# Patient Record
Sex: Male | Born: 1965
Health system: Southern US, Community
[De-identification: ages and names within clinical notes are randomized; demographics above are authoritative.]

## PROBLEM LIST (undated history)

## (undated) DIAGNOSIS — F1010 Alcohol abuse, uncomplicated: Secondary | ICD-10-CM

## (undated) DIAGNOSIS — R519 Headache, unspecified: Secondary | ICD-10-CM

## (undated) DIAGNOSIS — R51 Headache: Secondary | ICD-10-CM

## (undated) DIAGNOSIS — T7840XA Allergy, unspecified, initial encounter: Secondary | ICD-10-CM

## (undated) HISTORY — PX: NO PAST SURGERIES: SHX2092

## (undated) HISTORY — DX: Alcohol abuse, uncomplicated: F10.10

## (undated) HISTORY — DX: Allergy, unspecified, initial encounter: T78.40XA

## (undated) HISTORY — DX: Headache, unspecified: R51.9

## (undated) HISTORY — DX: Headache: R51

---

## 2017-09-21 ENCOUNTER — Emergency Department (HOSPITAL_BASED_OUTPATIENT_CLINIC_OR_DEPARTMENT_OTHER): Payer: BLUE CROSS/BLUE SHIELD

## 2017-09-21 ENCOUNTER — Encounter (HOSPITAL_BASED_OUTPATIENT_CLINIC_OR_DEPARTMENT_OTHER): Payer: Self-pay | Admitting: Emergency Medicine

## 2017-09-21 ENCOUNTER — Emergency Department (HOSPITAL_BASED_OUTPATIENT_CLINIC_OR_DEPARTMENT_OTHER)
Admission: EM | Admit: 2017-09-21 | Discharge: 2017-09-21 | Disposition: A | Discharge status: Home / Self Care | Payer: BLUE CROSS/BLUE SHIELD | Attending: Emergency Medicine | Admitting: Emergency Medicine

## 2017-09-21 ENCOUNTER — Other Ambulatory Visit: Payer: Self-pay

## 2017-09-21 DIAGNOSIS — R071 Chest pain on breathing: Secondary | ICD-10-CM | POA: Diagnosis not present

## 2017-09-21 DIAGNOSIS — R061 Stridor: Secondary | ICD-10-CM | POA: Diagnosis not present

## 2017-09-21 DIAGNOSIS — R05 Cough: Secondary | ICD-10-CM | POA: Diagnosis present

## 2017-09-21 DIAGNOSIS — N289 Disorder of kidney and ureter, unspecified: Secondary | ICD-10-CM

## 2017-09-21 LAB — CBC WITH DIFFERENTIAL/PLATELET
Basophils Absolute: 0 10*3/uL (ref 0.0–0.1)
Basophils Relative: 1 %
EOS ABS: 0.1 10*3/uL (ref 0.0–0.7)
EOS PCT: 1 %
HCT: 38.4 % — ABNORMAL LOW (ref 39.0–52.0)
Hemoglobin: 13.4 g/dL (ref 13.0–17.0)
LYMPHS ABS: 2.7 10*3/uL (ref 0.7–4.0)
Lymphocytes Relative: 34 %
MCH: 32.7 pg (ref 26.0–34.0)
MCHC: 34.9 g/dL (ref 30.0–36.0)
MCV: 93.7 fL (ref 78.0–100.0)
Monocytes Absolute: 0.7 10*3/uL (ref 0.1–1.0)
Monocytes Relative: 9 %
Neutro Abs: 4.3 10*3/uL (ref 1.7–7.7)
Neutrophils Relative %: 55 %
Platelets: 280 10*3/uL (ref 150–400)
RBC: 4.1 MIL/uL — AB (ref 4.22–5.81)
RDW: 12.4 % (ref 11.5–15.5)
WBC: 7.8 10*3/uL (ref 4.0–10.5)

## 2017-09-21 LAB — BASIC METABOLIC PANEL
ANION GAP: 10 (ref 5–15)
BUN: 25 mg/dL — ABNORMAL HIGH (ref 6–20)
CO2: 23 mmol/L (ref 22–32)
CREATININE: 1.73 mg/dL — AB (ref 0.61–1.24)
Calcium: 9.3 mg/dL (ref 8.9–10.3)
Chloride: 107 mmol/L (ref 101–111)
GFR calc Af Amer: 50 mL/min — ABNORMAL LOW (ref >60)
GFR, EST NON AFRICAN AMERICAN: 43 mL/min — AB (ref >60)
GLUCOSE: 95 mg/dL (ref 65–99)
Potassium: 3.5 mmol/L (ref 3.5–5.1)
Sodium: 140 mmol/L (ref 135–145)

## 2017-09-21 LAB — TROPONIN I: Troponin I: <0.03 ng/mL (ref <0.03)

## 2017-09-21 LAB — ETHANOL

## 2017-09-21 MED ORDER — ONDANSETRON HCL 4 MG/2ML IJ SOLN
4.0000 mg | Freq: Once | INTRAMUSCULAR | Status: AC
  Administered 2017-09-21: 4 mg via INTRAVENOUS
  Filled 2017-09-21: qty 2

## 2017-09-21 MED ORDER — DEXAMETHASONE SODIUM PHOSPHATE 10 MG/ML IJ SOLN
10.0000 mg | Freq: Once | INTRAMUSCULAR | Status: AC
  Administered 2017-09-21: 10 mg via INTRAVENOUS
  Filled 2017-09-21: qty 1

## 2017-09-21 MED ORDER — PANTOPRAZOLE SODIUM 40 MG IV SOLR
40.0000 mg | Freq: Once | INTRAVENOUS | Status: AC
  Administered 2017-09-21: 40 mg via INTRAVENOUS
  Filled 2017-09-21: qty 40

## 2017-09-21 MED ORDER — RACEPINEPHRINE HCL 2.25 % IN NEBU
0.5000 mL | INHALATION_SOLUTION | Freq: Once | RESPIRATORY_TRACT | Status: AC
  Administered 2017-09-21: 0.5 mL via RESPIRATORY_TRACT
  Filled 2017-09-21: qty 0.5

## 2017-09-21 NOTE — ED Triage Notes (Signed)
Pt brought to room by RT. Pt coughing. Pt c/o sharp chest pain and coughing. Pt states symptoms started after eating a burger with onions.

## 2017-09-21 NOTE — ED Provider Notes (Addendum)
[facility] Provider Note: Matthew DellJ. Lane [name], MD, FACEP  CSN: [id number] MRN: [id number] ARRIVAL: 09/21/17 at 0232 ROOM: [facility]   CHIEF COMPLAINT  Shortness of Breath   HISTORY OF PRESENT ILLNESS  09/21/17 2:42 AM Matthew Palmer is a [age] y.o. male who complains of intermittent cough with shortness of breath since about 10 PM yesterday evening.  The cough comes in fits.  He states he does not feel short of breath between coughing episodes.  When he coughs or takes a deep breath he has a sharp, well localized pain in his left parasternal region.  He denies a fever.  He did vomit once prior to arrival.  He states the symptoms began after eating a burger with onions.    History reviewed. No pertinent past medical history.  History reviewed. No pertinent surgical history.  Family History  Problem Relation Age of Onset  . Chronic Renal Failure Mother     Social History   Tobacco Use  . Smoking status: Never Smoker  . Smokeless tobacco: Never Used  Substance Use Topics  . Alcohol use: Yes  . Drug use: Not on file    Prior to Admission medications   Not on File    Allergies Patient has no known allergies.   REVIEW OF SYSTEMS  Negative except as noted here or in the History of Present Illness.   PHYSICAL EXAMINATION  Initial Vital Signs Blood pressure (!) 148/99, pulse (!) 110, temperature 98.4 F (36.9 C), temperature source Oral, resp. rate (!) 24, height 5\' 10"  (1.778 m), weight 99.8 kg (220 lb), SpO2 98 %.  Examination General: Well-developed, well-nourished male in no acute distress; appearance consistent with age of record HENT: normocephalic; atraumatic; pharynx normal Eyes: pupils equal, round and reactive to light; extraocular muscles intact Neck: supple Heart: regular rate and rhythm; tachycardia Lungs: Tachypnea; no stridor Abdomen: soft; nondistended; nontender; bowel sounds present Extremities: No deformity; full range of motion; pulses  normal; no edema Neurologic: Awake, alert and oriented; motor function intact in all extremities and symmetric; no facial droop Skin: Warm and dry Psychiatric: Normal mood and affect   RESULTS  Summary of this visit's results, reviewed by myself:  EKG Interpretation:  Date & Time: 09/21/2017 2:42 AM  Rate: 103  Rhythm: sinus tachycardia  QRS Axis: left  Intervals: normal  ST/T Wave abnormalities: normal  Conduction Disutrbances:none  Narrative Interpretation: Left atrial enlargement  Old EKG Reviewed: none available   Laboratory Studies: Results for orders placed or performed during the hospital encounter of 09/21/17 (from the past 24 hour(s))  CBC with Differential/Platelet     Status: Abnormal   Collection Time: 09/21/17  2:40 AM  Result Value Ref Range   WBC 7.8 4.0 - 10.5 K/uL   RBC 4.10 (L) 4.22 - 5.81 MIL/uL   Hemoglobin 13.4 13.0 - 17.0 g/dL   HCT 78.238.4 (L) 95.639.0 - 21.352.0 %   MCV 93.7 78.0 - 100.0 fL   MCH 32.7 26.0 - 34.0 pg   MCHC 34.9 30.0 - 36.0 g/dL   RDW 08.612.4 57.811.5 - 46.915.5 %   Platelets 280 150 - 400 K/uL   Neutrophils Relative % 55 %   Neutro Abs 4.3 1.7 - 7.7 K/uL   Lymphocytes Relative 34 %   Lymphs Abs 2.7 0.7 - 4.0 K/uL   Monocytes Relative 9 %   Monocytes Absolute 0.7 0.1 - 1.0 K/uL   Eosinophils Relative 1 %   Eosinophils Absolute 0.1 0.0 - 0.7 K/uL  Basophils Relative 1 %   Basophils Absolute 0.0 0.0 - 0.1 K/uL  Ethanol     Status: None   Collection Time: 09/21/17  2:40 AM  Result Value Ref Range   Alcohol, Ethyl (B) <10 <10 mg/dL  Basic metabolic panel     Status: Abnormal   Collection Time: 09/21/17  2:40 AM  Result Value Ref Range   Sodium 140 135 - 145 mmol/L   Potassium 3.5 3.5 - 5.1 mmol/L   Chloride 107 101 - 111 mmol/L   CO2 23 22 - 32 mmol/L   Glucose, Bld 95 65 - 99 mg/dL   BUN 25 (H) 6 - 20 mg/dL   Creatinine, Ser 1.61 (H) 0.61 - 1.24 mg/dL   Calcium 9.3 8.9 - 09.6 mg/dL   GFR calc non Af Amer 43 (L) >60 mL/min   GFR calc Af Amer  50 (L) >60 mL/min   Anion gap 10 5 - 15  Troponin I     Status: None   Collection Time: 09/21/17  2:40 AM  Result Value Ref Range   Troponin I <0.03 <0.03 ng/mL   Imaging Studies: Dg Chest 2 View  Result Date: 09/21/2017 CLINICAL DATA:  Chest pain after eating a cheeseburger last night. Cough and shortness of breath. EXAM: CHEST  2 VIEW COMPARISON:  None. FINDINGS: Normal heart size and pulmonary vascularity. No focal airspace disease or consolidation in the lungs. No blunting of costophrenic angles. No pneumothorax. Mediastinal contours appear intact. Old fracture deformities of left ribs and right distal clavicle. IMPRESSION: No active cardiopulmonary disease. Electronically Signed   By: Burman Nieves M.D.   On: 09/21/2017 03:24    ED COURSE  Nursing notes and initial vitals signs, including pulse oximetry, reviewed.  Vitals:   09/21/17 0237 09/21/17 0246  BP: (!) 148/99   Pulse: (!) 110   Resp: (!) 24   Temp: 98.4 F (36.9 C)   TempSrc: Oral   SpO2: 98% 97%  Weight: 99.8 kg (220 lb)   Height: 5\' 10"  (1.778 m)    3:33 AM Stridor, cough and tachypnea resolved after racemic epinephrine neb treatment.  Patient also given dexamethasone and Protonix.  He was advised of his borderline renal insufficiency and need for establishing with a primary care physician.  His mother had a history of focal segmental glomerulonephritis and required hemodialysis.  PROCEDURES    ED DIAGNOSES     ICD-10-CM   1. Stridor R06.1   2. Renal insufficiency N28.9        Matthew Repass, MD 09/21/17 0454    Matthew Libra, MD 09/21/17 [id number]

## 2017-09-24 ENCOUNTER — Encounter: Payer: Self-pay | Admitting: Family Medicine

## 2017-09-24 ENCOUNTER — Ambulatory Visit (INDEPENDENT_AMBULATORY_CARE_PROVIDER_SITE_OTHER): Payer: BLUE CROSS/BLUE SHIELD | Admitting: Family Medicine

## 2017-09-24 VITALS — BP 120/90 | HR 92 | Temp 98.1°F | Ht 70.0 in | Wt 237.4 lb

## 2017-09-24 DIAGNOSIS — B351 Tinea unguium: Secondary | ICD-10-CM | POA: Diagnosis not present

## 2017-09-24 DIAGNOSIS — R030 Elevated blood-pressure reading, without diagnosis of hypertension: Secondary | ICD-10-CM

## 2017-09-24 DIAGNOSIS — R7989 Other specified abnormal findings of blood chemistry: Secondary | ICD-10-CM | POA: Diagnosis not present

## 2017-09-24 DIAGNOSIS — Z23 Encounter for immunization: Secondary | ICD-10-CM

## 2017-09-24 DIAGNOSIS — K219 Gastro-esophageal reflux disease without esophagitis: Secondary | ICD-10-CM | POA: Diagnosis not present

## 2017-09-24 MED ORDER — TERBINAFINE HCL 250 MG PO TABS
250.0000 mg | ORAL_TABLET | Freq: Every day | ORAL | 2 refills | Status: DC
Start: 2017-09-24 — End: 2017-11-12

## 2017-09-24 MED ORDER — OMEPRAZOLE 20 MG PO TBEC: 20.0000 mg | DELAYED_RELEASE_TABLET | Freq: Two times a day (BID) | ORAL | 1 refills | Status: AC

## 2017-09-24 MED FILL — OMEPRAZOLE 20 MG CAP: 20 | 30 days supply | Qty: 60 | Fill #0 | Status: TO

## 2017-09-24 MED FILL — TERBINAFINE HCL 250 MG TAB: 250 | 30 days supply | Qty: 30 | Fill #0 | Status: TO

## 2017-09-24 NOTE — Progress Notes (Signed)
Chief Complaint  Patient presents with  . Establish Care       New Patient Visit SUBJECTIVE: HPI: Matthew Palmer is an 52 y.o.male who is being seen for establishing care.  The patient has not had pcp in many years.  CP- Central chest pain while lying down and after meals for around 1 week. Went to ED and told to f/u with pcp for recheck of kidney function.  He no longer lifts weights.  He was given his steroid and Protonix in the emergency department that resolved his pain.  He was not given anything for home.  He still has intermittent burning mainly when he lies down or after meals.  He denies any shortness of breath, fevers, jaw pain, or arm pain.  He will intermittently have a cough.  He is also been dealing with several years of thick and dark nails.  He thinks it is a fungus and would like to be on medication for this.  He has not tried anything at home.  There is no pain.  No Known Allergies  Past Medical History:  Diagnosis Date  . Alcohol abuse   . Allergy   . Headache    History reviewed. No pertinent surgical history.   Social History   Socioeconomic History  . Marital status: Single  Tobacco Use  . Smoking status: Never Smoker  . Smokeless tobacco: Never Used  Substance and Sexual Activity  . Alcohol use: Yes   Family History  Problem Relation Age of Onset  . Chronic Renal Failure Mother   . Cancer Mother   . Heart disease Mother   . Hyperlipidemia Mother   . Hypertension Mother   . Hyperlipidemia Father   . Hypertension Father   . Hypertension Sister   . Hypertension Brother    Takes no meds routinely.  ROS Cardiovascular: Denies palpitations  Respiratory: Denies dyspnea   OBJECTIVE: BP 120/90 (BP Location: Left Arm, Patient Position: Sitting, Cuff Size: Large)   Pulse 92   Temp 98.1 F (36.7 C) (Oral)   Ht 5\' 10"  (1.778 m)   Wt 237 lb 6 oz (107.7 kg)   SpO2 97%   BMI 34.06 kg/m   Constitutional: -  VS reviewed -  Well developed, well  nourished, appears stated age -  No apparent distress  Psychiatric: -  Oriented to person, place, and time -  Memory intact -  Affect and mood normal -  Fluent conversation, good eye contact -  Judgment and insight age appropriate  Eye: -  Conjunctivae clear, no discharge -  Pupils symmetric, round, reactive to light  Neck: -  No gross swelling, no palpable masses -  Thyroid midline, not enlarged, mobile, no palpable masses  Cardiovascular: -  RRR -  No LE edema  Respiratory: -  Normal respiratory effort, no accessory muscle use, no retraction -  Breath sounds equal, no wheezes, no ronchi, no crackles  Gastrointestinal: -  Bowel sounds normal -  No tenderness, no distention, no guarding, no masses  Musculoskeletal: -Thorax is nontender  Skin: -See below   Media Information      ASSESSMENT/PLAN: Gastroesophageal reflux disease, esophagitis presence not specified  Onychomycosis - Plan: Comprehensive metabolic panel  Elevated serum creatinine - Plan: Comprehensive metabolic panel  Elevated blood pressure reading  Need for influenza vaccination - Plan: Flu Vaccine QUAD 6+ mos PF IM (Fluarix Quad PF)  Need for Tdap vaccination - Plan: Tdap vaccine greater than or equal to 7yo IM  Orders  as above. 2 mo of ppi then wean down. Check LFTs and start lamisil.  Counseled on risks and benefits of oral medication for this.  He is not having pain. Consider getting home blood pressure monitor for home, we will recheck blood pressure at next visit. Patient should return in 6 weeks- can recheck liver function and do a cpe. The patient voiced understanding and agreement to the plan.   Jilda Roche [place], DO 09/24/17  4:45 PM '

## 2017-09-24 NOTE — Progress Notes (Signed)
Pre visit review using our clinic review tool, if applicable. No additional management support is needed unless otherwise documented below in the visit note. 

## 2017-09-24 NOTE — Patient Instructions (Addendum)
Give us 2-3 business days to get the results of your labs back. If labs are normal, you will likely receive a letter in the mail unless you have MyChart. This can take longer than 2-3 business days.   Consider getting a home blood pressure monitor. We will recheck in 6 weeks at your physical. Fast (no calories) for 9-12 hours prior to the visit.  Let us know if you need anything.

## 2017-09-25 LAB — COMPREHENSIVE METABOLIC PANEL
ALBUMIN: 4.3 g/dL (ref 3.5–5.2)
ALT: 23 U/L (ref 0–53)
AST: 17 U/L (ref 0–37)
Alkaline Phosphatase: 31 U/L — ABNORMAL LOW (ref 39–117)
BUN: 22 mg/dL (ref 6–23)
CALCIUM: 9.2 mg/dL (ref 8.4–10.5)
CO2: 26 meq/L (ref 19–32)
CREATININE: 1.46 mg/dL (ref 0.40–1.50)
Chloride: 107 mEq/L (ref 96–112)
GFR: 64.84 mL/min (ref >60.00)
Glucose, Bld: 85 mg/dL (ref 70–99)
POTASSIUM: 3.7 meq/L (ref 3.5–5.1)
Sodium: 141 mEq/L (ref 135–145)
Total Bilirubin: 0.3 mg/dL (ref 0.2–1.2)
Total Protein: 6.8 g/dL (ref 6.0–8.3)

## 2017-09-27 ENCOUNTER — Encounter: Payer: Self-pay | Admitting: Family Medicine

## 2017-11-08 ENCOUNTER — Encounter: Payer: BLUE CROSS/BLUE SHIELD | Admitting: Family Medicine

## 2017-11-12 ENCOUNTER — Ambulatory Visit (INDEPENDENT_AMBULATORY_CARE_PROVIDER_SITE_OTHER): Payer: BLUE CROSS/BLUE SHIELD | Admitting: Family Medicine

## 2017-11-12 ENCOUNTER — Encounter: Payer: Self-pay | Admitting: Family Medicine

## 2017-11-12 ENCOUNTER — Encounter: Payer: Self-pay | Admitting: Gastroenterology

## 2017-11-12 VITALS — BP 110/76 | HR 84 | Temp 98.0°F | Ht 70.0 in | Wt 234.2 lb

## 2017-11-12 DIAGNOSIS — N401 Enlarged prostate with lower urinary tract symptoms: Secondary | ICD-10-CM | POA: Diagnosis not present

## 2017-11-12 DIAGNOSIS — Z1211 Encounter for screening for malignant neoplasm of colon: Secondary | ICD-10-CM | POA: Diagnosis not present

## 2017-11-12 DIAGNOSIS — Z125 Encounter for screening for malignant neoplasm of prostate: Secondary | ICD-10-CM

## 2017-11-12 DIAGNOSIS — R350 Frequency of micturition: Secondary | ICD-10-CM

## 2017-11-12 DIAGNOSIS — Z0000 Encounter for general adult medical examination without abnormal findings: Secondary | ICD-10-CM | POA: Diagnosis not present

## 2017-11-12 DIAGNOSIS — M545 Low back pain, unspecified: Secondary | ICD-10-CM

## 2017-11-12 LAB — PSA: PSA: 0.34 ng/mL (ref 0.10–4.00)

## 2017-11-12 LAB — COMPREHENSIVE METABOLIC PANEL
ALK PHOS: 31 U/L — AB (ref 39–117)
ALT: 12 U/L (ref 0–53)
AST: 13 U/L (ref 0–37)
Albumin: 4.7 g/dL (ref 3.5–5.2)
BILIRUBIN TOTAL: 0.6 mg/dL (ref 0.2–1.2)
BUN: 20 mg/dL (ref 6–23)
CALCIUM: 10.4 mg/dL (ref 8.4–10.5)
CO2: 28 meq/L (ref 19–32)
Chloride: 105 mEq/L (ref 96–112)
Creatinine, Ser: 1.26 mg/dL (ref 0.40–1.50)
GFR: 76.81 mL/min (ref >60.00)
Glucose, Bld: 89 mg/dL (ref 70–99)
Potassium: 4.1 mEq/L (ref 3.5–5.1)
Sodium: 142 mEq/L (ref 135–145)
Total Protein: 7.2 g/dL (ref 6.0–8.3)

## 2017-11-12 LAB — LIPID PANEL
CHOLESTEROL: 202 mg/dL — AB (ref 0–200)
HDL: 44.4 mg/dL (ref >39.00)
LDL Cholesterol: 135 mg/dL — ABNORMAL HIGH (ref 0–99)
NONHDL: 158.04
Total CHOL/HDL Ratio: 5
Triglycerides: 116 mg/dL (ref 0.0–149.0)
VLDL: 23.2 mg/dL (ref 0.0–40.0)

## 2017-11-12 MED ORDER — NAPROXEN 500 MG PO TABS: 500.0000 mg | ORAL_TABLET | Freq: Two times a day (BID) | ORAL | 0 refills | Status: AC

## 2017-11-12 MED ORDER — TERBINAFINE HCL 250 MG PO TABS: 250.0000 mg | ORAL_TABLET | Freq: Every day | ORAL | 2 refills | Status: AC

## 2017-11-12 MED ORDER — KETOROLAC TROMETHAMINE 60 MG/2ML IM SOLN
60.0000 mg | Freq: Once | INTRAMUSCULAR | Status: AC
  Administered 2017-11-12: 60 mg via INTRAMUSCULAR

## 2017-11-12 NOTE — Progress Notes (Signed)
Chief Complaint  Patient presents with  . Annual Exam  . Back Pain    Well Male Matthew Palmer is here for a complete physical.   His last physical was >1 year ago.  Current diet: in general, a "healthy" diet   Current exercise: Lifting weights, some walking Weight trend: stable Does pt snore? Yes; no apneic episodes Daytime fatigue? No. Seat belt? Yes.    Health maintenance Colonoscopy- No Tetanus- Yes HIV- Yes 11/13/03 Hep C- Yes 11/13/03 Prostate cancer screening- No   Midline low back pain over past week. Went to UC and placed on steroid, opiate and msk relaxant. No better. Denies numbness, tingling, weakness, or loss of bowel/bladder function.    Past Medical History:  Diagnosis Date  . Alcohol abuse   . Allergy   . Headache     Past Surgical History:  Procedure Laterality Date  . NO PAST SURGERIES     Medications  Current Outpatient Medications on File Prior to Visit  Medication Sig Dispense Refill  . Omeprazole 20 MG TBEC Take 1 tablet (20 mg total) by mouth 2 (two) times daily before a meal. 60 tablet 1  . terbinafine (LAMISIL) 250 MG tablet Take 1 tablet (250 mg total) by mouth daily. 30 tablet 2   Allergies No Known Allergies   Family History Family History  Problem Relation Age of Onset  . Chronic Renal Failure Mother   . Cancer Mother   . Heart disease Mother   . Hyperlipidemia Mother   . Hypertension Mother   . Hyperlipidemia Father   . Hypertension Father   . Hypertension Sister   . Hypertension Brother     Review of Systems: Constitutional:  no fevers or chills Eye:  no recent significant change in vision Ear/Nose/Mouth/Throat:  Ears:  no hearing loss Nose/Mouth/Throat:  no complaints of nasal congestion or bleeding, no sore throat Cardiovascular:  no chest pain, no palpitations Respiratory:  no cough and no shortness of breath Gastrointestinal:  no abdominal pain, no change in bowel habits, no nausea, vomiting, diarrhea, or constipation  and no black or bloody stool GU:  Male: negative for dysuria, frequency, and incontinence and negative for prostate symptoms Musculoskeletal/Extremities: +back pain; otherwise no pain, redness, or swelling of the joints Integumentary (Skin/Breast):  no abnormal skin lesions reported Neurologic:  no headaches Endocrine: No unexpected weight changes Hematologic/Lymphatic:  no abnormal bleeding  Exam BP 110/76 (BP Location: Left Arm, Patient Position: Sitting, Cuff Size: Large)   Pulse 84   Temp 98 F (36.7 C) (Oral)   Ht 5\' 10"  (1.778 m)   Wt 234 lb 4 oz (106.3 kg)   SpO2 98%   BMI 33.61 kg/m  General:  well developed, well nourished, in no apparent distress Skin:  no significant moles, warts, or growths Head:  no masses, lesions, or tenderness Eyes:  pupils equal and round, sclera anicteric without injection Ears:  canals without lesions, TMs shiny without retraction, no obvious effusion, no erythema Nose:  nares patent, septum midline, mucosa normal Throat/Pharynx:  lips and gingiva without lesion; tongue and uvula midline; non-inflamed pharynx; no exudates or postnasal drainage Neck: neck supple without adenopathy, thyromegaly, or masses Lungs:  clear to auscultation, breath sounds equal bilaterally, no respiratory distress Cardio:  regular rate and rhythm without murmurs, heart sounds without clicks or rubs Abdomen:  abdomen soft, nontender; bowel sounds normal; no masses or organomegaly Genital (male): circumcised penis, no lesions or discharge; testes present bilaterally without masses or tenderness Rectal: Deferred  Musculoskeletal: +midline lumbar ttp, neg straight leg, poor hamstring ROM; otherwise symmetrical muscle groups noted without atrophy or deformity; 5/5 strength throughout Extremities:  no clubbing, cyanosis, or edema, no deformities, no skin discoloration Neuro:  gait normal; deep tendon reflexes normal and symmetric Psych: well oriented with normal range of affect  and appropriate judgment/insight  Assessment and Plan  Well adult exam - Plan: Comprehensive metabolic panel, Lipid panel  Screening for prostate cancer - Plan: PSA  Acute midline low back pain without sciatica - Plan: ketorolac (TORADOL) injection 60 mg  Benign prostatic hyperplasia with urinary frequency  Screen for colon cancer - Plan: Ambulatory referral to Gastroenterology   Well 52 y.o. male. Counseled on diet and exercise. Counseled on risks and benefits of prostate cancer screening with PSA. The patient agrees to undergo testing. Immunizations, labs, and further orders as above. Toradol, Naproxen, Tylenol, heat, ice, stretches/exercises. Sports med vs OMT at next appt.  Follow up in 1 week to recheck back pain. The patient voiced understanding and agreement to the plan.  Jilda Rocheicholas [place], DO 11/12/17 3:29 PM

## 2017-11-12 NOTE — Patient Instructions (Addendum)
Cancel appointment in 1 week if you are doing better.  Give us 2-3 business days to get the results of your labs back. If labs are normal, you will likely receive a letter in the mail unless you have MyChart. This can take longer than 2-3 business days.   EXERCISES  RANGE OF MOTION (ROM) AND STRETCHING EXERCISES - Low Back Prain Most people with lower back pain will find that their symptoms get worse with excessive bending forward (flexion) or arching at the lower back (extension). The exercises that will help resolve your symptoms will focus on the opposite motion.  If you have pain, numbness or tingling which travels down into your buttocks, leg or foot, the goal of the therapy is for these symptoms to move closer to your back and eventually resolve. Sometimes, these leg symptoms will get better, but your lower back pain may worsen. This is often an indication of progress in your rehabilitation. Be very alert to any changes in your symptoms and the activities in which you participated in the 24 hours prior to the change. Sharing this information with your caregiver will allow him or her to most efficiently treat your condition. These exercises may help you when beginning to rehabilitate your injury. Your symptoms may resolve with or without further involvement from your physician, physical therapist or athletic trainer. While completing these exercises, remember:   Restoring tissue flexibility helps normal motion to return to the joints. This allows healthier, less painful movement and activity.  An effective stretch should be held for at least 30 seconds.  A stretch should never be painful. You should only feel a gentle lengthening or release in the stretched tissue. FLEXION RANGE OF MOTION AND STRETCHING EXERCISES:  STRETCH - Flexion, Single Knee to Chest   Lie on a firm bed or floor with both legs extended in front of you.  Keeping one leg in contact with the floor, bring your opposite knee  to your chest. Hold your leg in place by either grabbing behind your thigh or at your knee.  Pull until you feel a gentle stretch in your low back. Hold 30 seconds.  Slowly release your grasp and repeat the exercise with the opposite side. Repeat 2 times. Complete this exercise 3 times per week.   STRETCH - Flexion, Double Knee to Chest  Lie on a firm bed or floor with both legs extended in front of you.  Keeping one leg in contact with the floor, bring your opposite knee to your chest.  Tense your stomach muscles to support your back and then lift your other knee to your chest. Hold your legs in place by either grabbing behind your thighs or at your knees.  Pull both knees toward your chest until you feel a gentle stretch in your low back. Hold 30 seconds.  Tense your stomach muscles and slowly return one leg at a time to the floor. Repeat 2 times. Complete this exercise 3 times per week.   STRETCH - Low Trunk Rotation  Lie on a firm bed or floor. Keeping your legs in front of you, bend your knees so they are both pointed toward the ceiling and your feet are flat on the floor.  Extend your arms out to the side. This will stabilize your upper body by keeping your shoulders in contact with the floor.  Gently and slowly drop both knees together to one side until you feel a gentle stretch in your low back. Hold for 30 seconds.  Tense your stomach muscles to support your lower back as you bring your knees back to the starting position. Repeat the exercise to the other side. Repeat 2 times. Complete this exercise at least 3 times per week.   EXTENSION RANGE OF MOTION AND FLEXIBILITY EXERCISES:  STRETCH - Extension, Prone on Elbows   Lie on your stomach on the floor, a bed will be too soft. Place your palms about shoulder width apart and at the height of your head.  Place your elbows under your shoulders. If this is too painful, stack pillows under your chest.  Allow your body to  relax so that your hips drop lower and make contact more completely with the floor.  Hold this position for 30 seconds.  Slowly return to lying flat on the floor. Repeat 2 times. Complete this exercise 3 times per week.   RANGE OF MOTION - Extension, Prone Press Ups  Lie on your stomach on the floor, a bed will be too soft. Place your palms about shoulder width apart and at the height of your head.  Keeping your back as relaxed as possible, slowly straighten your elbows while keeping your hips on the floor. You may adjust the placement of your hands to maximize your comfort. As you gain motion, your hands will come more underneath your shoulders.  Hold this position 30 seconds.  Slowly return to lying flat on the floor. Repeat 2 times. Complete this exercise 3 times per week.   RANGE OF MOTION- Quadruped, Neutral Spine   Assume a hands and knees position on a firm surface. Keep your hands under your shoulders and your knees under your hips. You may place padding under your knees for comfort.  Drop your head and point your tailbone toward the ground below you. This will round out your lower back like an angry cat. Hold this position for 30 seconds.  Slowly lift your head and release your tail bone so that your back sags into a large arch, like an old horse.  Hold this position for 30 seconds.  Repeat this until you feel limber in your low back.  Now, find your "sweet spot." This will be the most comfortable position somewhere between the two previous positions. This is your neutral spine. Once you have found this position, tense your stomach muscles to support your low back.  Hold this position for 30 seconds. Repeat 2 times. Complete this exercise 3 times per week.   STRENGTHENING EXERCISES - Low Back Sprain These exercises may help you when beginning to rehabilitate your injury. These exercises should be done near your "sweet spot." This is the neutral, low-back arch, somewhere  between fully rounded and fully arched, that is your least painful position. When performed in this safe range of motion, these exercises can be used for people who have either a flexion or extension based injury. These exercises may resolve your symptoms with or without further involvement from your physician, physical therapist or athletic trainer. While completing these exercises, remember:   Muscles can gain both the endurance and the strength needed for everyday activities through controlled exercises.  Complete these exercises as instructed by your physician, physical therapist or athletic trainer. Increase the resistance and repetitions only as guided.  You may experience muscle soreness or fatigue, but the pain or discomfort you are trying to eliminate should never worsen during these exercises. If this pain does worsen, stop and make certain you are following the directions exactly. If the pain is  still present after adjustments, discontinue the exercise until you can discuss the trouble with your caregiver.  STRENGTHENING - Deep Abdominals, Pelvic Tilt   Lie on a firm bed or floor. Keeping your legs in front of you, bend your knees so they are both pointed toward the ceiling and your feet are flat on the floor.  Tense your lower abdominal muscles to press your low back into the floor. This motion will rotate your pelvis so that your tail bone is scooping upwards rather than pointing at your feet or into the floor. With a gentle tension and even breathing, hold this position for 3 seconds. Repeat 2 times. Complete this exercise 3 times per week.   STRENGTHENING - Abdominals, Crunches   Lie on a firm bed or floor. Keeping your legs in front of you, bend your knees so they are both pointed toward the ceiling and your feet are flat on the floor. Cross your arms over your chest.  Slightly tip your chin down without bending your neck.  Tense your abdominals and slowly lift your trunk high  enough to just clear your shoulder blades. Lifting higher can put excessive stress on the lower back and does not further strengthen your abdominal muscles.  Control your return to the starting position. Repeat 2 times. Complete this exercise 3 times per week.   STRENGTHENING - Quadruped, Opposite UE/LE Lift   Assume a hands and knees position on a firm surface. Keep your hands under your shoulders and your knees under your hips. You may place padding under your knees for comfort.  Find your neutral spine and gently tense your abdominal muscles so that you can maintain this position. Your shoulders and hips should form a rectangle that is parallel with the floor and is not twisted.  Keeping your trunk steady, lift your right hand no higher than your shoulder and then your left leg no higher than your hip. Make sure you are not holding your breath. Hold this position for 30 seconds.  Continuing to keep your abdominal muscles tense and your back steady, slowly return to your starting position. Repeat with the opposite arm and leg. Repeat 2 times. Complete this exercise 3 times per week.   STRENGTHENING - Abdominals and Quadriceps, Straight Leg Raise   Lie on a firm bed or floor with both legs extended in front of you.  Keeping one leg in contact with the floor, bend the other knee so that your foot can rest flat on the floor.  Find your neutral spine, and tense your abdominal muscles to maintain your spinal position throughout the exercise.  Slowly lift your straight leg off the floor about 6 inches for a count of 3, making sure to not hold your breath.  Still keeping your neutral spine, slowly lower your leg all the way to the floor. Repeat this exercise with each leg 2 times. Complete this exercise 3 times per week.  POSTURE AND BODY MECHANICS CONSIDERATIONS - Low Back Sprain Keeping correct posture when sitting, standing or completing your activities will reduce the stress put on  different body tissues, allowing injured tissues a chance to heal and limiting painful experiences. The following are general guidelines for improved posture.  While reading these guidelines, remember:  The exercises prescribed by your provider will help you have the flexibility and strength to maintain correct postures.  The correct posture provides the best environment for your joints to work. All of your joints have less wear and tear when  properly supported by a spine with good posture. This means you will experience a healthier, less painful body.  Correct posture must be practiced with all of your activities, especially prolonged sitting and standing. Correct posture is as important when doing repetitive low-stress activities (typing) as it is when doing a single heavy-load activity (lifting).  RESTING POSITIONS Consider which positions are most painful for you when choosing a resting position. If you have pain with flexion-based activities (sitting, bending, stooping, squatting), choose a position that allows you to rest in a less flexed posture. You would want to avoid curling into a fetal position on your side. If your pain worsens with extension-based activities (prolonged standing, working overhead), avoid resting in an extended position such as sleeping on your stomach. Most people will find more comfort when they rest with their spine in a more neutral position, neither too rounded nor too arched. Lying on a non-sagging bed on your side with a pillow between your knees, or on your back with a pillow under your knees will often provide some relief. Keep in mind, being in any one position for a prolonged period of time, no matter how correct your posture, can still lead to stiffness.  PROPER SITTING POSTURE In order to minimize stress and discomfort on your spine, you must sit with correct posture. Sitting with good posture should be effortless for a healthy body. Returning to good posture is  a gradual process. Many people can work toward this most comfortably by using various supports until they have the flexibility and strength to maintain this posture on their own. When sitting with proper posture, your ears will fall over your shoulders and your shoulders will fall over your hips. You should use the back of the chair to support your upper back. Your lower back will be in a neutral position, just slightly arched. You may place a small pillow or folded towel at the base of your lower back for  support.  When working at a desk, create an environment that supports good, upright posture. Without extra support, muscles tire, which leads to excessive strain on joints and other tissues. Keep these recommendations in mind:  CHAIR:  A chair should be able to slide under your desk when your back makes contact with the back of the chair. This allows you to work closely.  The chair's height should allow your eyes to be level with the upper part of your monitor and your hands to be slightly lower than your elbows.  BODY POSITION  Your feet should make contact with the floor. If this is not possible, use a foot rest.  Keep your ears over your shoulders. This will reduce stress on your neck and low back.  INCORRECT SITTING POSTURES  If you are feeling tired and unable to assume a healthy sitting posture, do not slouch or slump. This puts excessive strain on your back tissues, causing more damage and pain. Healthier options include:  Using more support, like a lumbar pillow.  Switching tasks to something that requires you to be upright or walking.  Talking a brief walk.  Lying down to rest in a neutral-spine position.  PROLONGED STANDING WHILE SLIGHTLY LEANING FORWARD  When completing a task that requires you to lean forward while standing in one place for a long time, place either foot up on a stationary 2-4 inch high object to help maintain the best posture. When both feet are on the  ground, the lower back tends to lose  its slight inward curve. If this curve flattens (or becomes too large), then the back and your other joints will experience too much stress, tire more quickly, and can cause pain.  CORRECT STANDING POSTURES Proper standing posture should be assumed with all daily activities, even if they only take a few moments, like when brushing your teeth. As in sitting, your ears should fall over your shoulders and your shoulders should fall over your hips. You should keep a slight tension in your abdominal muscles to brace your spine. Your tailbone should point down to the ground, not behind your body, resulting in an over-extended swayback posture.   INCORRECT STANDING POSTURES  Common incorrect standing postures include a forward head, locked knees and/or an excessive swayback. WALKING Walk with an upright posture. Your ears, shoulders and hips should all line-up.  PROLONGED ACTIVITY IN A FLEXED POSITION When completing a task that requires you to bend forward at your waist or lean over a low surface, try to find a way to stabilize 3 out of 4 of your limbs. You can place a hand or elbow on your thigh or rest a knee on the surface you are reaching across. This will provide you more stability, so that your muscles do not tire as quickly. By keeping your knees relaxed, or slightly bent, you will also reduce stress across your lower back. CORRECT LIFTING TECHNIQUES  DO :  Assume a wide stance. This will provide you more stability and the opportunity to get as close as possible to the object which you are lifting.  Tense your abdominals to brace your spine. Bend at the knees and hips. Keeping your back locked in a neutral-spine position, lift using your leg muscles. Lift with your legs, keeping your back straight.  Test the weight of unknown objects before attempting to lift them.  Try to keep your elbows locked down at your sides in order get the best strength from your  shoulders when carrying an object.     Always ask for help when lifting heavy or awkward objects. INCORRECT LIFTING TECHNIQUES DO NOT:   Lock your knees when lifting, even if it is a small object.  Bend and twist. Pivot at your feet or move your feet when needing to change directions.  Assume that you can safely pick up even a paperclip without proper posture.

## 2017-11-12 NOTE — Progress Notes (Signed)
Pre visit review using our clinic review tool, if applicable. No additional management support is needed unless otherwise documented below in the visit note. 

## 2017-11-13 ENCOUNTER — Telehealth: Payer: Self-pay | Admitting: *Deleted

## 2017-11-13 NOTE — Telephone Encounter (Signed)
Pt called stating back pain has worsened. Was up all night and unable to go to work today. Has taken 3 naproxen since yesterday and has had no relief. Pt is requesting something different for pain and states he spoke with PCP about this at his visit yesterday.  Please advise?

## 2017-11-14 ENCOUNTER — Telehealth: Payer: Self-pay | Admitting: Family Medicine

## 2017-11-14 DIAGNOSIS — M545 Low back pain, unspecified: Secondary | ICD-10-CM

## 2017-11-14 MED ORDER — TRAMADOL HCL 50 MG PO TABS: 50.0000 mg | ORAL_TABLET | Freq: Three times a day (TID) | ORAL | 0 refills | Status: DC | PRN

## 2017-11-14 NOTE — Telephone Encounter (Signed)
Copied from CRM [phone]. Topic: Quick Communication - See Telephone Encounter >> Nov 14, 2017  9:18 AM Jolayne Hainesaylor, Brittany L wrote: CRM for notification. See Telephone encounter for:   11/14/17.[  Pt was in on 2/25 and saw Dr Carmelia RollerWendling, he mentioned to him he was having back pain. He wants to know if he could have something for the pain  [facility] [id number] - [place], [place] - [address]

## 2017-11-14 NOTE — Telephone Encounter (Signed)
Tramadol called in until he can see us for follow up. TY.

## 2017-11-19 ENCOUNTER — Ambulatory Visit: Payer: BLUE CROSS/BLUE SHIELD | Admitting: Family Medicine

## 2017-11-20 ENCOUNTER — Encounter: Payer: Self-pay | Admitting: Family Medicine

## 2017-11-26 ENCOUNTER — Other Ambulatory Visit: Payer: Self-pay | Admitting: Family Medicine

## 2017-11-27 MED ORDER — TRAMADOL HCL 50 MG PO TABS: 50.0000 mg | ORAL_TABLET | Freq: Three times a day (TID) | ORAL | 0 refills | Status: AC | PRN

## 2017-11-27 NOTE — Telephone Encounter (Signed)
I sent in a refill. I am going to refer him to the sports medicine team upstairs. If he starts to get better, he can cancel. I would like him to continue heat and stretches/exercises in the meantime.

## 2017-11-27 NOTE — Telephone Encounter (Signed)
Pt following up on this med request for pain med.  Pt states he is still having back pain. Pt states he did not need the appt 3/04 is why he could not get off work. Pt states he does not want to loose his job and really needs something for his lower back pain.  [id number] - [place], [place] - [address] [phone] (Phone) [phone] (Fax)

## 2017-11-28 NOTE — Telephone Encounter (Signed)
Patient informed of pcp instructions. 

## 2017-12-12 ENCOUNTER — Encounter: Payer: Self-pay | Admitting: Family Medicine

## 2017-12-26 ENCOUNTER — Encounter: Payer: BLUE CROSS/BLUE SHIELD | Admitting: Gastroenterology

## 2018-07-24 ENCOUNTER — Emergency Department (HOSPITAL_BASED_OUTPATIENT_CLINIC_OR_DEPARTMENT_OTHER)
Admission: EM | Admit: 2018-07-24 | Discharge: 2018-07-24 | Disposition: A | Discharge status: Home / Self Care | Payer: BLUE CROSS/BLUE SHIELD | Attending: Emergency Medicine | Admitting: Emergency Medicine

## 2018-07-24 ENCOUNTER — Emergency Department (HOSPITAL_BASED_OUTPATIENT_CLINIC_OR_DEPARTMENT_OTHER): Payer: BLUE CROSS/BLUE SHIELD

## 2018-07-24 ENCOUNTER — Other Ambulatory Visit: Payer: Self-pay

## 2018-07-24 ENCOUNTER — Encounter (HOSPITAL_BASED_OUTPATIENT_CLINIC_OR_DEPARTMENT_OTHER): Payer: Self-pay | Admitting: Emergency Medicine

## 2018-07-24 DIAGNOSIS — S62102A Fracture of unspecified carpal bone, left wrist, initial encounter for closed fracture: Secondary | ICD-10-CM

## 2018-07-24 DIAGNOSIS — Y999 Unspecified external cause status: Secondary | ICD-10-CM | POA: Insufficient documentation

## 2018-07-24 DIAGNOSIS — Y939 Activity, unspecified: Secondary | ICD-10-CM | POA: Insufficient documentation

## 2018-07-24 DIAGNOSIS — S62002A Unspecified fracture of navicular [scaphoid] bone of left wrist, initial encounter for closed fracture: Secondary | ICD-10-CM | POA: Insufficient documentation

## 2018-07-24 DIAGNOSIS — W19XXXA Unspecified fall, initial encounter: Secondary | ICD-10-CM | POA: Insufficient documentation

## 2018-07-24 DIAGNOSIS — Y929 Unspecified place or not applicable: Secondary | ICD-10-CM | POA: Insufficient documentation

## 2018-07-24 DIAGNOSIS — Z79899 Other long term (current) drug therapy: Secondary | ICD-10-CM | POA: Insufficient documentation

## 2018-07-24 DIAGNOSIS — M25532 Pain in left wrist: Secondary | ICD-10-CM

## 2018-07-24 MED ORDER — ACETAMINOPHEN 500 MG PO TABS
1000.0000 mg | ORAL_TABLET | Freq: Once | ORAL | Status: AC
  Administered 2018-07-24: 1000 mg via ORAL
  Filled 2018-07-24: qty 2

## 2018-07-24 NOTE — Discharge Instructions (Addendum)
Follow up with the sports medicine physician

## 2018-07-24 NOTE — ED Triage Notes (Signed)
Patient states that he fell yesterday and hurt his left wrist - the patient has an obvious deformity to his left wrist

## 2018-07-24 NOTE — ED Provider Notes (Signed)
[facility] [place] EMERGENCY DEPARTMENT Provider Note   CSN: [id number] Arrival date & time: 07/24/18  0932     History   Chief Complaint Chief Complaint  Patient presents with  . Wrist Pain    HPI Matthew Palmer is a 52 y.o. male.  HPI Patient is a 52 year old male presents the emergency department with complaints of pain in his left wrist.  He states he fell on this yesterday and has had pain there.  He has had intermittent pain in his left wrist for many years now.  He has had injuries to his wrist before in the past.  No other complaints.  No other injuries.  Symptoms are mild to moderate in severity and worse with palpation and is anatomic snuffbox on the left wrist.  No obvious deformity notable   Past Medical History:  Diagnosis Date  . Alcohol abuse   . Allergy   . Headache     Patient Active Problem List   Diagnosis Date Noted  . Benign prostatic hyperplasia with urinary frequency 11/12/2017  . Well adult exam 11/12/2017  . Onychomycosis 09/24/2017  . Gastroesophageal reflux disease 09/24/2017    Past Surgical History:  Procedure Laterality Date  . NO PAST SURGERIES          Home Medications    Prior to Admission medications   Medication Sig Start Date End Date Taking? Authorizing Provider  naproxen (NAPROSYN) 500 MG tablet Take 1 tablet (500 mg total) by mouth 2 (two) times daily with a meal. 11/12/17   Wendling, Jilda Roche, DO  omeprazole (PRILOSEC) 20 MG capsule TAKE 1 CAPSULE BY MOUTH TWICE DAILY BEFORE MEAL 11/26/17   Carmelia Roller, Jilda Roche, DO  Omeprazole 20 MG TBEC Take 1 tablet (20 mg total) by mouth 2 (two) times daily before a meal. 09/24/17   Wendling, Jilda Roche, DO  terbinafine (LAMISIL) 250 MG tablet Take 1 tablet (250 mg total) by mouth daily. 11/12/17   Sharlene Dory, DO  traMADol (ULTRAM) 50 MG tablet Take 1 tablet (50 mg total) by mouth every 8 (eight) hours as needed. 11/27/17   Sharlene Dory, DO    Family  History Family History  Problem Relation Age of Onset  . Chronic Renal Failure Mother   . Cancer Mother   . Heart disease Mother   . Hyperlipidemia Mother   . Hypertension Mother   . Hyperlipidemia Father   . Hypertension Father   . Hypertension Sister   . Hypertension Brother     Social History Social History   Tobacco Use  . Smoking status: Never Smoker  . Smokeless tobacco: Never Used  Substance Use Topics  . Alcohol use: Yes  . Drug use: Yes     Allergies   Patient has no known allergies.   Review of Systems Review of Systems  All other systems reviewed and are negative.    Physical Exam Updated Vital Signs BP (!) 146/104 (BP Location: Right Arm)   Pulse 92   Temp 98.5 F (36.9 C) (Oral)   Resp 18   Ht 5\' 10"  (1.778 m)   Wt 108.9 kg   SpO2 100%   BMI 34.44 kg/m     Physical Exam  Constitutional: He is oriented to person, place, and time. He appears well-developed and well-nourished.  HENT:  Head: Normocephalic.  Eyes: EOM are normal.  Neck: Normal range of motion.  Pulmonary/Chest: Effort normal and breath sounds normal.  Abdominal: He exhibits no distension.  Musculoskeletal: Normal  range of motion.  Tenderness anatomic snuffbox in the left without obvious deformity.  Neurological: He is alert and oriented to person, place, and time.  Psychiatric: He has a normal mood and affect.  Nursing note and vitals reviewed.    ED Treatments / Results  Labs (all labs ordered are listed, but only abnormal results are displayed) Labs Reviewed - No data to display  EKG None  Radiology Dg Wrist Complete Left  Result Date: 07/24/2018 CLINICAL DATA:  Radial wrist pain and swelling after fall on Monday. EXAM: LEFT WRIST - COMPLETE 3+ VIEW COMPARISON:  None. FINDINGS: Nondisplaced scaphoid waist fracture has a more subacute to chronic appearance with a more sclerotic margin along the distal fragment. No sclerosis of the proximal scaphoid pole. No  dislocation. Joint spaces are preserved. Bone mineralization is normal. Mild soft tissue swelling about the wrist. IMPRESSION: 1. Subacute to chronic nondisplaced fracture of the scaphoid waist. Electronically Signed   By: Obie Dredge M.D.   On: 07/24/2018 10:18    Procedures .Splint Application Performed by: Azalia Bilis, MD Authorized by: Azalia Bilis, MD     SPLINT APPLICATION Authorized by: Azalia Bilis Consent: Verbal consent obtained. Risks and benefits: risks, benefits and alternatives were discussed Consent given by: patient Splint applied by: tech Location details: Left wrist Splint type: Velcro thumb spica Supplies used: Velcro thumb spica Post-procedure: The splinted body part was neurovascularly unchanged following the procedure. Patient tolerance: Patient tolerated the procedure well with no immediate complications.     Medications Ordered in ED Medications  acetaminophen (TYLENOL) tablet 1,000 mg (1,000 mg Oral Given 07/24/18 1047)     Initial Impression / Assessment and Plan / ED Course  I have reviewed the triage vital signs and the nursing notes.  Pertinent labs & imaging results that were available during my care of the patient were reviewed by me and considered in my medical decision making (see chart for details).    Likely acute on chronic scaphoid fracture.  Tenderness in the anatomic snuffbox.  Home with a Velcro thumb spica.  Sports medicine follow-up.  Personally reviewed the patient's x-rays.   Final Clinical Impressions(s) / ED Diagnoses   Final diagnoses:  Acute pain of left wrist  Wrist fracture, closed, left, initial encounter    ED Discharge Orders    None       Azalia Bilis, MD 07/24/18 1449

## 2018-07-25 ENCOUNTER — Ambulatory Visit: Payer: Self-pay | Admitting: Family Medicine

## 2018-07-30 ENCOUNTER — Ambulatory Visit: Payer: Self-pay | Admitting: Family Medicine

## 2020-09-27 IMAGING — CR DG WRIST COMPLETE 3+V*L*
4 series · 4 of 4 positions shown · non-contrast
Comparison: None.

CLINICAL DATA: Radial wrist pain and swelling after fall on [REDACTED].

EXAM:
LEFT WRIST - COMPLETE 3+ VIEW

[x wrist pa left]
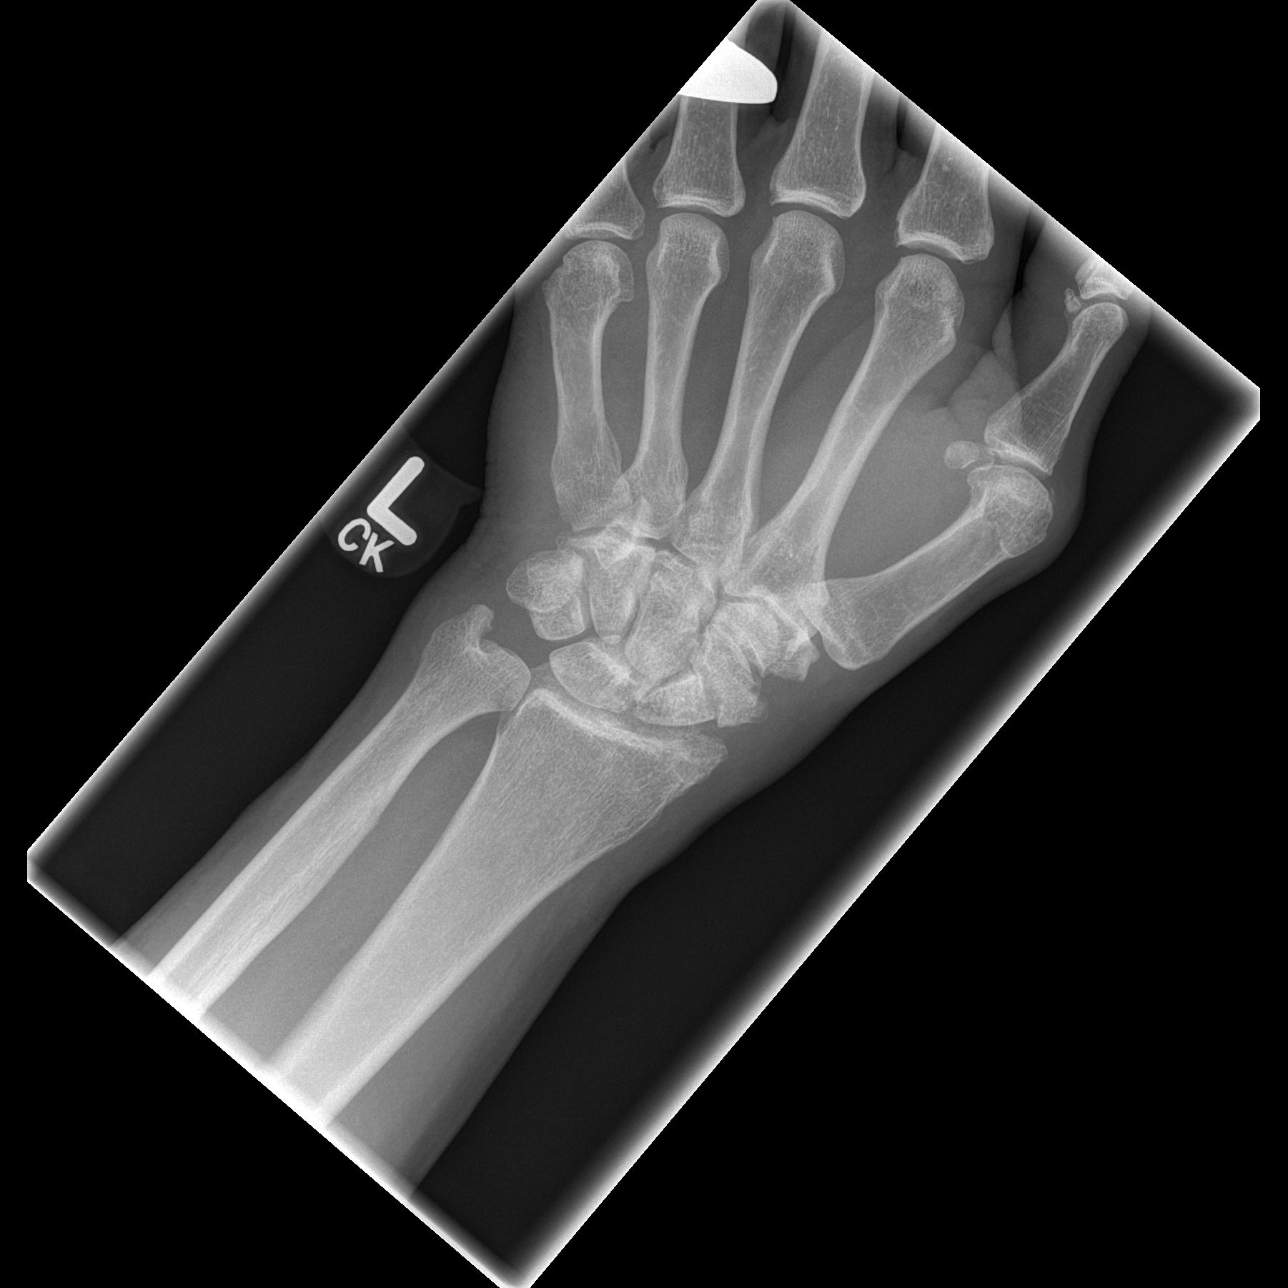

[x wrist obl left]
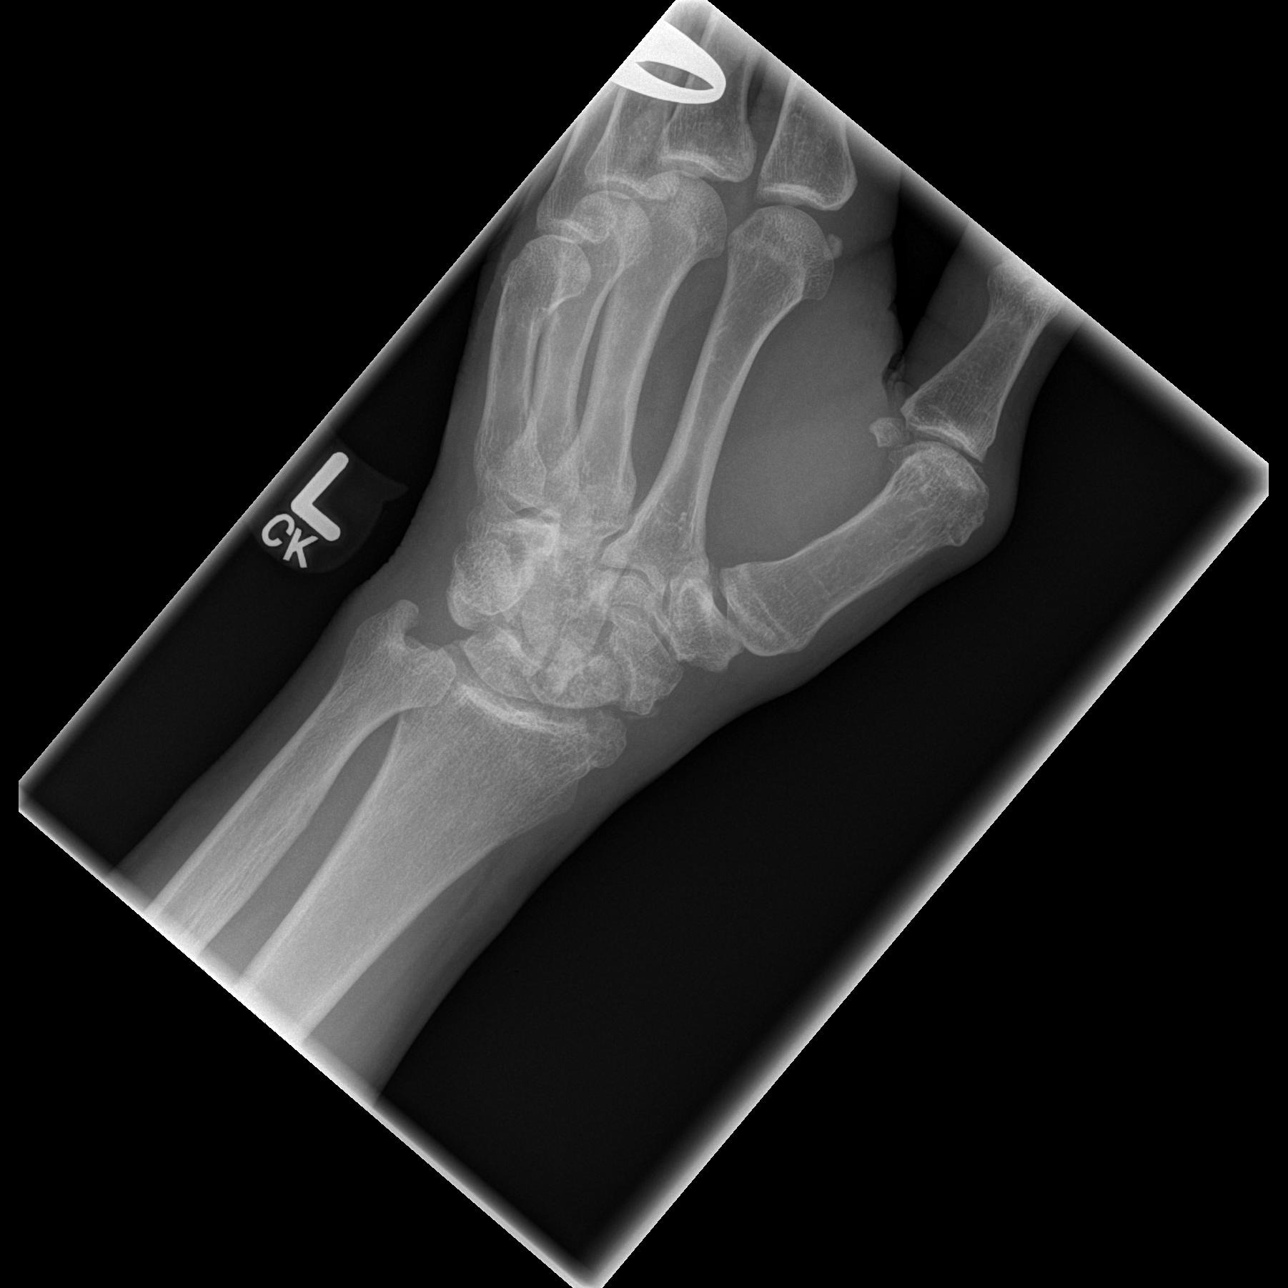

[x wrist lat left]
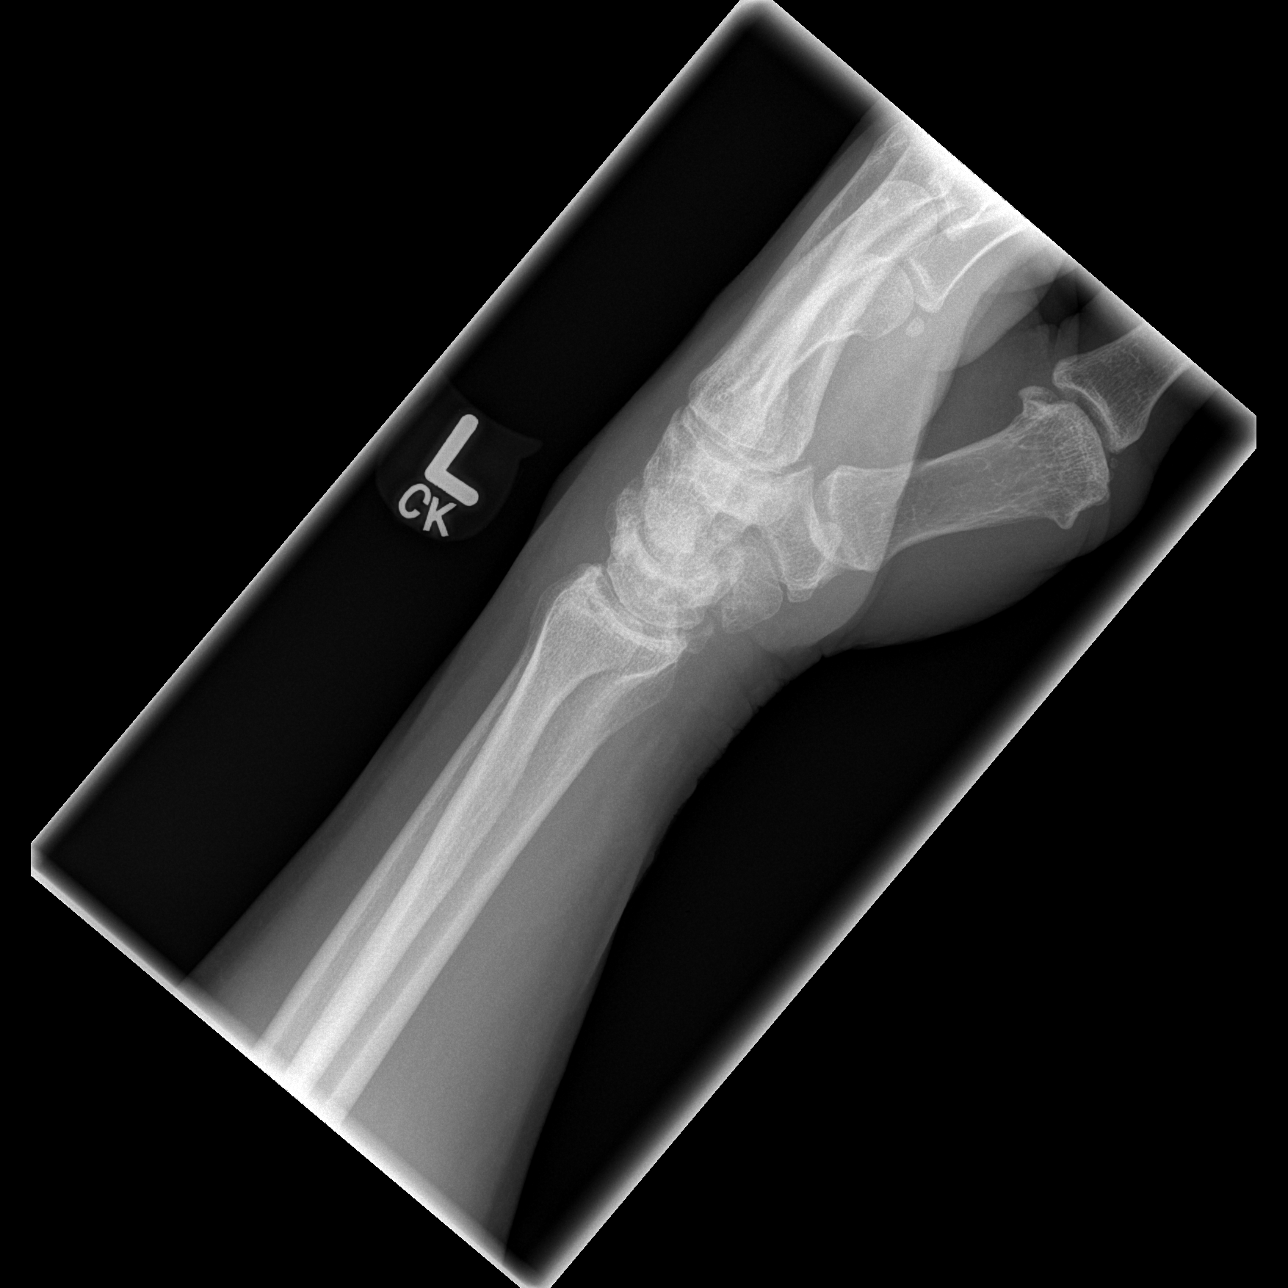

[x navicular]
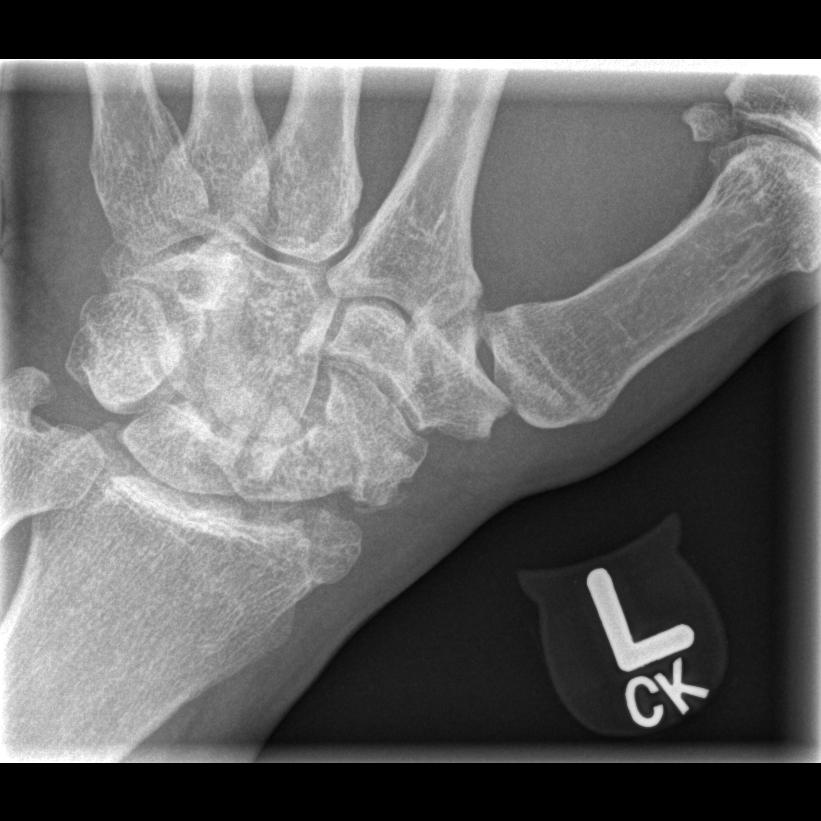

[4 of 4 positions shown; findings below may reference images not displayed]

FINDINGS: Nondisplaced scaphoid waist fracture has a more subacute to chronic
appearance with a more sclerotic margin along the distal fragment.
No sclerosis of the proximal scaphoid pole. No dislocation. Joint
spaces are preserved. Bone mineralization is normal. Mild soft
tissue swelling about the wrist.
IMPRESSION: 1. Subacute to chronic nondisplaced fracture of the scaphoid waist.
